# Patient Record
Sex: Female | Born: 1965 | Race: Black or African American | Hispanic: No | Marital: Single | Smoking: Current every day smoker
Health system: Southern US, Community
[De-identification: ages and names within clinical notes are randomized; demographics above are authoritative.]

## PROBLEM LIST (undated history)

## (undated) DIAGNOSIS — R6889 Other general symptoms and signs: Secondary | ICD-10-CM

## (undated) DIAGNOSIS — R195 Other fecal abnormalities: Secondary | ICD-10-CM

## (undated) DIAGNOSIS — Z1211 Encounter for screening for malignant neoplasm of colon: Secondary | ICD-10-CM

## (undated) DIAGNOSIS — I10 Essential (primary) hypertension: Secondary | ICD-10-CM

## (undated) HISTORY — PX: TUBAL LIGATION: SHX77

---

## 2012-11-29 ENCOUNTER — Emergency Department (HOSPITAL_BASED_OUTPATIENT_CLINIC_OR_DEPARTMENT_OTHER)
Admission: EM | Admit: 2012-11-29 | Discharge: 2012-11-29 | Disposition: A | Discharge status: Home / Self Care | Payer: Self-pay | Attending: Emergency Medicine | Admitting: Emergency Medicine

## 2012-11-29 ENCOUNTER — Encounter (HOSPITAL_BASED_OUTPATIENT_CLINIC_OR_DEPARTMENT_OTHER): Payer: Self-pay | Admitting: *Deleted

## 2012-11-29 DIAGNOSIS — Z79899 Other long term (current) drug therapy: Secondary | ICD-10-CM | POA: Insufficient documentation

## 2012-11-29 DIAGNOSIS — I10 Essential (primary) hypertension: Secondary | ICD-10-CM | POA: Insufficient documentation

## 2012-11-29 DIAGNOSIS — F17200 Nicotine dependence, unspecified, uncomplicated: Secondary | ICD-10-CM | POA: Insufficient documentation

## 2012-11-29 DIAGNOSIS — R002 Palpitations: Secondary | ICD-10-CM | POA: Insufficient documentation

## 2012-11-29 DIAGNOSIS — Z880 Allergy status to penicillin: Secondary | ICD-10-CM | POA: Insufficient documentation

## 2012-11-29 DIAGNOSIS — D649 Anemia, unspecified: Secondary | ICD-10-CM | POA: Insufficient documentation

## 2012-11-29 HISTORY — DX: Essential (primary) hypertension: I10

## 2012-11-29 LAB — BASIC METABOLIC PANEL
CO2: 24 mEq/L (ref 19–32)
Calcium: 9.7 mg/dL (ref 8.4–10.5)
Glucose, Bld: 90 mg/dL (ref 70–99)
Sodium: 142 mEq/L (ref 135–145)

## 2012-11-29 LAB — CBC WITH DIFFERENTIAL/PLATELET
Eosinophils Relative: 2 % (ref 0–5)
HCT: 30.1 % — ABNORMAL LOW (ref 36.0–46.0)
Lymphocytes Relative: 28 % (ref 12–46)
Lymphs Abs: 2.9 10*3/uL (ref 0.7–4.0)
MCV: 79.8 fL (ref 78.0–100.0)
Monocytes Absolute: 0.6 10*3/uL (ref 0.1–1.0)
Platelets: 525 10*3/uL — ABNORMAL HIGH (ref 150–400)
RBC: 3.77 MIL/uL — ABNORMAL LOW (ref 3.87–5.11)
WBC: 10.1 10*3/uL (ref 4.0–10.5)

## 2012-11-29 LAB — URINE MICROSCOPIC-ADD ON

## 2012-11-29 LAB — URINALYSIS, ROUTINE W REFLEX MICROSCOPIC
Glucose, UA: NEGATIVE mg/dL
Ketones, ur: NEGATIVE mg/dL
Specific Gravity, Urine: 1.02 (ref 1.005–1.030)
pH: 6 (ref 5.0–8.0)

## 2012-11-29 MED ORDER — HYDROCHLOROTHIAZIDE 25 MG PO TABS: 25.0000 mg | ORAL_TABLET | Freq: Every day | ORAL | Status: DC

## 2012-11-29 MED ORDER — FERROUS SULFATE 325 (65 FE) MG PO TABS: 325.0000 mg | ORAL_TABLET | Freq: Every day | ORAL | Status: AC

## 2012-11-29 NOTE — ED Notes (Signed)
Pt stated earlier today took a shower, got short of breath, felt like her heart was racing, felt nauseous, chest pain, felt hot. States she still feels this way. Denies vomiting. States she's had similar episodes for the last three years.

## 2012-11-29 NOTE — ED Provider Notes (Signed)
History    CSN: [id number] Arrival date & time 11/29/12  1608  First MD Initiated Contact with Patient 11/29/12 1702     Chief Complaint  Patient presents with  . Hypertension   (Consider location/radiation/quality/duration/timing/severity/associated sxs/prior Treatment) Patient is a 47 y.o. female presenting with hypertension.  Hypertension   Pt with history of HTN, not taking any medications for the last 2 years reports she was in the shower earlier today and began to feel 'claustophobic', states she began feeling like her heart was racing and having headache and blurry vision. She sat outside for a few minutes and then went to run some errands. While she was out, she stopped at a drug store and checked her BP and found it to be 140/90s. She came to the ED for further eval. She reports she has had moderate aching pain in back of head, neck and moving around to her L arm and shoulder and down to L chest for the last 3 years with multiple prior evaluations at [facility]. She does not have PCP. Feeling asymptomatic now.  Past Medical History  Diagnosis Date  . Hypertension    History reviewed. No pertinent past surgical history. History reviewed. No pertinent family history. History  Substance Use Topics  . Smoking status: Current Every Day Smoker    Types: Cigarettes  . Smokeless tobacco: Not on file  . Alcohol Use: No   OB History   Grav Para Term Preterm Abortions TAB SAB Ect Mult Living                 Review of Systems All other systems reviewed and are negative except as noted in HPI.   Allergies  Penicillins and Sulfa antibiotics  Home Medications   Current Outpatient Rx  Name  Route  Sig  Dispense  Refill  . hydrochlorothiazide (HYDRODIURIL) 25 MG tablet   Oral   Take 25 mg by mouth daily.          BP 141/88  Pulse 95  Temp(Src) 98.4 F (36.9 C)  Resp 16  Ht 5\' 4"  (1.626 m)  Wt 190 lb (86.183 kg)  BMI 32.6 kg/m2  SpO2 100%  LMP 11/02/2012 Physical Exam   Nursing note and vitals reviewed. Constitutional: She is oriented to person, place, and time. She appears well-developed and well-nourished.  HENT:  Head: Normocephalic and atraumatic.  Eyes: EOM are normal. Pupils are equal, round, and reactive to light.  Neck: Normal range of motion. Neck supple.  Cardiovascular: Normal rate, normal heart sounds and intact distal pulses.   Pulmonary/Chest: Effort normal and breath sounds normal.  Abdominal: Bowel sounds are normal. She exhibits no distension. There is no tenderness.  Musculoskeletal: Normal range of motion. She exhibits no edema and no tenderness.  Neurological: She is alert and oriented to person, place, and time. She has normal strength. No cranial nerve deficit or sensory deficit.  Skin: Skin is warm and dry. No rash noted.  Psychiatric: She has a normal mood and affect.    ED Course  Procedures (including critical care time) Labs Reviewed  CBC WITH DIFFERENTIAL - Abnormal; Notable for the following:    RBC 3.77 (*)    Hemoglobin 9.7 (*)    HCT 30.1 (*)    MCH 25.7 (*)    Platelets 525 (*)    All other components within normal limits  BASIC METABOLIC PANEL - Abnormal; Notable for the following:    GFR calc non Af Amer 67 (*)  GFR calc Af Amer 78 (*)    All other components within normal limits  URINALYSIS, ROUTINE W REFLEX MICROSCOPIC - Abnormal; Notable for the following:    Leukocytes, UA SMALL (*)    All other components within normal limits  URINE MICROSCOPIC-ADD ON - Abnormal; Notable for the following:    Bacteria, UA FEW (*)    All other components within normal limits  URINE CULTURE  TROPONIN I   No results found. 1. Palpitations   2. HTN (hypertension)   3. Anemia     MDM   Date: 11/29/2012  Rate: 89  Rhythm: normal sinus rhythm  QRS Axis: normal  Intervals: normal  ST/T Wave abnormalities: normal  Conduction Disutrbances:none  Narrative Interpretation:   Old EKG Reviewed: none available  6:21  PM Pt remains asymptomatic. No tachycardia here. Labs reviewed, moderate anemia attributable to heavy periods the last 5 months. Advised HCTZ for HTN and Fe for anemia with close PCP followup for recheck in 1-2 weeks.    Charles B. Bernette Mayers, MD 11/29/12 Rickey Primus

## 2012-11-29 NOTE — ED Notes (Addendum)
Pt c/o hypertension with blurred vision and SOB, ha  x 2 days also c/o palpitations

## 2012-12-01 LAB — URINE CULTURE

## 2012-12-03 ENCOUNTER — Telehealth (HOSPITAL_COMMUNITY): Payer: Self-pay | Admitting: Emergency Medicine

## 2012-12-03 NOTE — ED Notes (Signed)
Post ED Visit - Positive Culture Follow-up  Culture report reviewed by antimicrobial stewardship pharmacist: []  Wes Dulaney, Pharm.D., BCPS [x]  Celedonio Miyamoto, Pharm.D., BCPS []  Georgina Pillion, Pharm.D., BCPS []  [place], [address].D., BCPS, AAHIVP []  Estella Husk, Pharm.D., BCPS, AAHIVP  Positive urine culture Per Roxy Horseman PA-C, no treatment since asymptomatic and no further patient follow-up is required at this time.  Kylie A Holland 12/03/2012, 9:36 AM

## 2012-12-21 ENCOUNTER — Ambulatory Visit: Payer: Self-pay | Attending: Family Medicine | Admitting: Internal Medicine

## 2012-12-21 VITALS — BP 129/88 | HR 91 | Temp 97.7°F | Resp 17 | Wt 188.4 lb

## 2012-12-21 DIAGNOSIS — I10 Essential (primary) hypertension: Secondary | ICD-10-CM | POA: Insufficient documentation

## 2012-12-21 DIAGNOSIS — M542 Cervicalgia: Secondary | ICD-10-CM | POA: Insufficient documentation

## 2012-12-21 DIAGNOSIS — F329 Major depressive disorder, single episode, unspecified: Secondary | ICD-10-CM | POA: Insufficient documentation

## 2012-12-21 DIAGNOSIS — M545 Low back pain, unspecified: Secondary | ICD-10-CM | POA: Insufficient documentation

## 2012-12-21 DIAGNOSIS — F32A Depression, unspecified: Secondary | ICD-10-CM | POA: Insufficient documentation

## 2012-12-21 NOTE — Progress Notes (Signed)
Patient here to establish care Was in ED recently for palpatations and anemia Takes medication for blood pressure and iron pills

## 2012-12-21 NOTE — Progress Notes (Signed)
Patient ID: Jacqueline Kerr, female   DOB: 03-25-1966, 47 y.o.   MRN: [id number]  CC: To establish care  HPI: Jacqueline Kerr is a 47 year old woman seen here today to establish medical care. She has complaints of neck pain and low back pain and is chronic. She has history of hypertension. She does not remember any trauma to the neck or to the back. No numbness of any limb. She admits to having a very stressful life condition especially financially and socially, she is not married, no children. She takes hydrochlorothiazide for blood pressure. She was recently treated for UTI.   Allergies  Allergen Reactions  . Penicillins   . Sulfa Antibiotics    Past Medical History  Diagnosis Date  . Hypertension    Current Outpatient Prescriptions on File Prior to Visit  Medication Sig Dispense Refill  . ferrous sulfate 325 (65 FE) MG tablet Take 1 tablet (325 mg total) by mouth daily.  30 tablet  0  . hydrochlorothiazide (HYDRODIURIL) 25 MG tablet Take 1 tablet (25 mg total) by mouth daily.  30 tablet  2   No current facility-administered medications on file prior to visit.   History reviewed. No pertinent family history. History   Social History  . Marital Status: Single    Spouse Name: N/A    Number of Children: N/A  . Years of Education: N/A   Occupational History  . Not on file.   Social History Main Topics  . Smoking status: Current Every Day Smoker    Types: Cigarettes  . Smokeless tobacco: Not on file  . Alcohol Use: No  . Drug Use: No  . Sexual Activity: Yes    Birth Control/ Protection: None   Other Topics Concern  . Not on file   Social History Narrative  . No narrative on file    Review of Systems: Constitutional: Negative for fever, chills, diaphoresis, activity change, appetite change and fatigue. HENT: Negative for ear pain, nosebleeds, congestion, facial swelling, rhinorrhea, neck pain, neck stiffness and ear discharge.  Eyes: Negative for pain, discharge, redness,  itching and visual disturbance. Respiratory: Negative for cough, choking, chest tightness, shortness of breath, wheezing and stridor.  Cardiovascular: Negative for chest pain, palpitations and leg swelling. Gastrointestinal: Negative for abdominal distention. Genitourinary: Negative for dysuria, urgency, frequency, hematuria, flank pain, decreased urine volume, difficulty urinating and dyspareunia.  Musculoskeletal: Negative for back pain, joint swelling, arthralgias and gait problem. Neurological: Negative for dizziness, tremors, seizures, syncope, facial asymmetry, speech difficulty, weakness, light-headedness, numbness and headaches.  Hematological: Negative for adenopathy. Does not bruise/bleed easily. Psychiatric/Behavioral: Negative for hallucinations, behavioral problems, confusion, dysphoric mood, decreased concentration and agitation.    Objective:   Filed Vitals:   12/21/12 1220  BP: 129/88  Pulse: 91  Temp: 97.7 F (36.5 C)  Resp: 17    Physical Exam: Constitutional: Patient appears well-developed and well-nourished. No distress. HENT: Normocephalic, atraumatic, External right and left ear normal. Oropharynx is clear and moist.  Eyes: Conjunctivae and EOM are normal. PERRLA, no scleral icterus. Neck: Normal ROM. Neck supple. No JVD. No tracheal deviation. No thyromegaly. CVS: RRR, S1/S2 +, no murmurs, no gallops, no carotid bruit.  Pulmonary: Effort and breath sounds normal, no stridor, rhonchi, wheezes, rales.  Abdominal: Soft. BS +,  no distension, tenderness, rebound or guarding.  Musculoskeletal: Normal range of motion. No edema and no tenderness.  Lymphadenopathy: No lymphadenopathy noted, cervical, inguinal or axillary Neuro: Alert. Normal reflexes, muscle tone coordination. No cranial nerve deficit. Skin: Skin  is warm and dry. No rash noted. Not diaphoretic. No erythema. No pallor. Psychiatric: Normal mood and affect. Behavior, judgment, thought content  normal.  Lab Results  Component Value Date   WBC 10.1 11/29/2012   HGB 9.7* 11/29/2012   HCT 30.1* 11/29/2012   MCV 79.8 11/29/2012   PLT 525* 11/29/2012   Lab Results  Component Value Date   CREATININE 1.00 11/29/2012   BUN 9 11/29/2012   NA 142 11/29/2012   K 3.7 11/29/2012   CL 106 11/29/2012   CO2 24 11/29/2012    No results found for this basename: HGBA1C   Lipid Panel  No results found for this basename: chol, trig, hdl, cholhdl, vldl, ldlcalc       Assessment and plan:   Patient Active Problem List   Diagnosis Date Noted  . Essential hypertension, benign 12/21/2012  . Neck pain, musculoskeletal 12/21/2012  . Low back pain 12/21/2012  . Depression (emotion) 12/21/2012   Continue hydrochlorothiazide 25 mg by mouth daily Continue ferrous sulfate 325 mg tablet by mouth daily Patient was referred to a Child psychotherapist here for counseling and resources available were given to patient Will obtain x-ray of the cervical spine x-ray of the lumbar spine Patient declined referral to psychiatrist, saying she has enough support at home, lives with family members including parents and siblings  Norlene Lanes was given clear instructions to go to ER or return to the clinic if symptoms don't improve, worsen or new problems develop.  Emika Tiano verbalized understanding.  Camaria Gerald was told to call to get lab results if hasn't heard anything in the next week.        Jeanann Lewandowsky, MD [facility] [place], [place] [phone]   12/21/2012, 6:29 PM

## 2012-12-22 ENCOUNTER — Ambulatory Visit (HOSPITAL_COMMUNITY)
Admission: RE | Admit: 2012-12-22 | Discharge: 2012-12-22 | Disposition: A | Discharge status: Home / Self Care | Payer: Self-pay | Source: Ambulatory Visit | Attending: Internal Medicine | Admitting: Internal Medicine

## 2012-12-22 ENCOUNTER — Encounter: Payer: Self-pay | Admitting: Internal Medicine

## 2012-12-22 ENCOUNTER — Telehealth: Payer: Self-pay | Admitting: *Deleted

## 2012-12-22 DIAGNOSIS — M542 Cervicalgia: Secondary | ICD-10-CM

## 2012-12-22 DIAGNOSIS — M47812 Spondylosis without myelopathy or radiculopathy, cervical region: Secondary | ICD-10-CM | POA: Insufficient documentation

## 2012-12-22 DIAGNOSIS — M5380 Other specified dorsopathies, site unspecified: Secondary | ICD-10-CM | POA: Insufficient documentation

## 2012-12-22 DIAGNOSIS — M545 Low back pain, unspecified: Secondary | ICD-10-CM | POA: Insufficient documentation

## 2012-12-22 LAB — HEMOGLOBIN A1C
Hgb A1c MFr Bld: 5.2 % (ref <5.7)
Mean Plasma Glucose: 103 mg/dL (ref <117)

## 2012-12-22 LAB — TSH: TSH: 1.456 u[IU]/mL (ref 0.350–4.500)

## 2012-12-22 NOTE — Telephone Encounter (Signed)
12/22/12 Attempt to reach patient not available at number provided. P.Zackory Pudlo,RN BSN [facility]

## 2012-12-25 ENCOUNTER — Telehealth: Payer: Self-pay

## 2012-12-25 NOTE — Telephone Encounter (Signed)
Patient aware of xray results.   

## 2012-12-25 NOTE — Telephone Encounter (Signed)
Message copied by Lestine Mount on Mon Dec 25, 2012  9:12 AM ------      Message from: Quentin Angst      Created: Fri Dec 22, 2012  5:36 PM       Please call patient and informed her that her x-ray of the neck and low back came back within normal. ------

## 2013-03-15 ENCOUNTER — Other Ambulatory Visit: Payer: Self-pay

## 2013-12-31 ENCOUNTER — Ambulatory Visit (HOSPITAL_COMMUNITY)
Admission: RE | Admit: 2013-12-31 | Discharge: 2013-12-31 | Disposition: A | Discharge status: Home / Self Care | Payer: Self-pay | Source: Ambulatory Visit | Attending: Internal Medicine | Admitting: Internal Medicine

## 2013-12-31 ENCOUNTER — Ambulatory Visit: Payer: Self-pay | Attending: Internal Medicine | Admitting: Internal Medicine

## 2013-12-31 ENCOUNTER — Encounter: Payer: Self-pay | Admitting: Internal Medicine

## 2013-12-31 ENCOUNTER — Other Ambulatory Visit: Payer: Self-pay

## 2013-12-31 VITALS — BP 154/96 | HR 79 | Temp 98.1°F | Resp 16 | Ht 60.0 in | Wt 190.2 lb

## 2013-12-31 DIAGNOSIS — Z880 Allergy status to penicillin: Secondary | ICD-10-CM | POA: Insufficient documentation

## 2013-12-31 DIAGNOSIS — M25519 Pain in unspecified shoulder: Secondary | ICD-10-CM | POA: Insufficient documentation

## 2013-12-31 DIAGNOSIS — M7989 Other specified soft tissue disorders: Secondary | ICD-10-CM | POA: Insufficient documentation

## 2013-12-31 DIAGNOSIS — I10 Essential (primary) hypertension: Secondary | ICD-10-CM

## 2013-12-31 DIAGNOSIS — R200 Anesthesia of skin: Secondary | ICD-10-CM

## 2013-12-31 DIAGNOSIS — Z8249 Family history of ischemic heart disease and other diseases of the circulatory system: Secondary | ICD-10-CM | POA: Insufficient documentation

## 2013-12-31 DIAGNOSIS — R0789 Other chest pain: Secondary | ICD-10-CM

## 2013-12-31 DIAGNOSIS — R202 Paresthesia of skin: Secondary | ICD-10-CM

## 2013-12-31 DIAGNOSIS — F17200 Nicotine dependence, unspecified, uncomplicated: Secondary | ICD-10-CM | POA: Insufficient documentation

## 2013-12-31 DIAGNOSIS — Z882 Allergy status to sulfonamides status: Secondary | ICD-10-CM | POA: Insufficient documentation

## 2013-12-31 DIAGNOSIS — R209 Unspecified disturbances of skin sensation: Secondary | ICD-10-CM | POA: Insufficient documentation

## 2013-12-31 MED ORDER — HYDROCHLOROTHIAZIDE 25 MG PO TABS: 25.0000 mg | ORAL_TABLET | Freq: Every day | ORAL | Status: AC

## 2013-12-31 MED ORDER — CYCLOBENZAPRINE HCL 10 MG PO TABS: 10.0000 mg | ORAL_TABLET | Freq: Every day | ORAL | Status: AC

## 2013-12-31 NOTE — Progress Notes (Signed)
Patient ID: Jacqueline Kerr, female   DOB: [birth year], 48 y.o.   MRN: [id number]  CC: left arm pain  HPI: Patient presents to clinic for headaches and bilateral leg swelling for the past ten days.  Patient reports that she has been out of HCTZ since May.  She states that her left shoulder has been painful and it causes the pain to radiate to her neck and upper back to left breast.  Some chest discomfort has been noted for the past ten days as well with numbness and ache in her left fingers.   Allergies  Allergen Reactions  . Penicillins   . Sulfa Antibiotics    Past Medical History  Diagnosis Date  . Hypertension    Current Outpatient Prescriptions on File Prior to Visit  Medication Sig Dispense Refill  . ferrous sulfate 325 (65 FE) MG tablet Take 1 tablet (325 mg total) by mouth daily.  30 tablet  0  . hydrochlorothiazide (HYDRODIURIL) 25 MG tablet Take 1 tablet (25 mg total) by mouth daily.  30 tablet  2   No current facility-administered medications on file prior to visit.   Family History  Problem Relation Age of Onset  . Hypertension Mother   . Hypertension Father    History   Social History  . Marital Status: Single    Spouse Name: N/A    Number of Children: N/A  . Years of Education: N/A   Occupational History  . Not on file.   Social History Main Topics  . Smoking status: Current Every Day Smoker    Types: Cigarettes  . Smokeless tobacco: Not on file  . Alcohol Use: No  . Drug Use: No  . Sexual Activity: Yes    Birth Control/ Protection: None   Other Topics Concern  . Not on file   Social History Narrative  . No narrative on file    Review of Systems  Eyes: Negative.   Respiratory: Negative.   Cardiovascular: Positive for chest pain and leg swelling. Negative for palpitations, orthopnea and claudication.  Neurological: Positive for tingling and headaches. Negative for dizziness.   .    Objective:   Filed Vitals:   12/31/13 1728  BP: 154/96   Pulse: 79  Temp: 98.1 F (36.7 C)  Resp: 16    Physical Exam: Constitutional: Patient appears well-developed and well-nourished. No distress. HENT: Normocephalic, atraumatic, External right and left ear normal. Oropharynx is clear and moist.  Eyes: Conjunctivae and EOM are normal. PERRLA, no scleral icterus. Neck: Normal ROM. Neck supple. No JVD. No tracheal deviation. No thyromegaly. CVS: RRR, S1/S2 +, no murmurs, no gallops, no carotid bruit.  Pulmonary: Effort and breath sounds normal, no stridor, rhonchi, wheezes, rales.  Abdominal: Soft. BS +,  no distension, tenderness, rebound or guarding.  Musculoskeletal: Normal range of motion. No edema and no tenderness.  Lymphadenopathy: No lymphadenopathy noted, cervical Neuro: Alert. Normal reflexes, muscle tone coordination. No cranial nerve deficit. Skin: Skin is warm and dry. No rash noted. Not diaphoretic. No erythema. No pallor. Psychiatric: Normal mood and affect. Behavior, judgment, thought content normal.  Lab Results  Component Value Date   WBC 10.1 11/29/2012   HGB 9.7* 11/29/2012   HCT 30.1* 11/29/2012   MCV 79.8 11/29/2012   PLT 525* 11/29/2012   Lab Results  Component Value Date   CREATININE 1.00 11/29/2012   BUN 9 11/29/2012   NA 142 11/29/2012   K 3.7 11/29/2012   CL 106 11/29/2012   CO2 24  11/29/2012    Lab Results  Component Value Date   HGBA1C 5.2 12/21/2012   Lipid Panel  No results found for this basename: chol, trig, hdl, cholhdl, vldl, ldlcalc       Assessment and plan:   Gitel was seen today for leg swelling and shoulder pain.  Diagnoses and associated orders for this visit:  Numbness and tingling in left arm - CBC - COMPLETE METABOLIC PANEL WITH GFR - DG Shoulder Left; Future - Troponin I  Essential hypertension - hydrochlorothiazide (HYDRODIURIL) 25 MG tablet; Take 1 tablet (25 mg total) by mouth daily.  Other Orders - cyclobenzaprine (FLEXERIL) 10 MG tablet; Take 1 tablet (10 mg total) by  mouth at bedtime.   Gave patient strict return precautions that should warrant prompt evaluation by the ER.  I will call patient with results from Troponin.       Holland Commons, NP-C [facility] [phone] 12/31/2013, 5:59 PM

## 2013-12-31 NOTE — Progress Notes (Signed)
Pt complain of swelling on legs and feet, Hx HBP not taking medication  Stated shoulder RT pain no injury.

## 2014-01-01 ENCOUNTER — Telehealth: Payer: Self-pay | Admitting: *Deleted

## 2014-01-01 LAB — COMPLETE METABOLIC PANEL WITH GFR
ALT: 11 U/L (ref 0–35)
AST: 17 U/L (ref 0–37)
Albumin: 4.4 g/dL (ref 3.5–5.2)
Alkaline Phosphatase: 85 U/L (ref 39–117)
BUN: 8 mg/dL (ref 6–23)
CALCIUM: 9.1 mg/dL (ref 8.4–10.5)
CHLORIDE: 110 meq/L (ref 96–112)
CO2: 25 meq/L (ref 19–32)
CREATININE: 0.89 mg/dL (ref 0.50–1.10)
GFR, EST AFRICAN AMERICAN: 89 mL/min
GFR, EST NON AFRICAN AMERICAN: 77 mL/min
GLUCOSE: 86 mg/dL (ref 70–99)
Potassium: 4.2 mEq/L (ref 3.5–5.3)
Sodium: 143 mEq/L (ref 135–145)
Total Bilirubin: 0.4 mg/dL (ref 0.2–1.2)
Total Protein: 6.9 g/dL (ref 6.0–8.3)

## 2014-01-01 LAB — CBC
HEMATOCRIT: 26.5 % — AB (ref 36.0–46.0)
HEMOGLOBIN: 7.6 g/dL — AB (ref 12.0–15.0)
MCH: 17.6 pg — ABNORMAL LOW (ref 26.0–34.0)
MCHC: 28.7 g/dL — ABNORMAL LOW (ref 30.0–36.0)
MCV: 61.2 fL — AB (ref 78.0–100.0)
PLATELETS: 492 10*3/uL — AB (ref 150–400)
RBC: 4.33 MIL/uL (ref 3.87–5.11)
RDW: 20.9 % — ABNORMAL HIGH (ref 11.5–15.5)
WBC: 7.9 10*3/uL (ref 4.0–10.5)

## 2014-01-01 LAB — TROPONIN I: Troponin I: 0.01 ng/mL (ref <0.06)

## 2014-01-01 NOTE — Telephone Encounter (Signed)
Left voice massage to returned call    Result Note      Please send patient ferrous sulfate 325 mg to be taken TID with meals. May need stool softener to help with constipation.  She is anemic

## 2014-01-02 ENCOUNTER — Telehealth: Payer: Self-pay | Admitting: Emergency Medicine

## 2014-01-02 ENCOUNTER — Telehealth: Payer: Self-pay | Admitting: Internal Medicine

## 2014-01-02 NOTE — Telephone Encounter (Signed)
Pt given negative blood work report. Verbalized understanding

## 2014-01-02 NOTE — Telephone Encounter (Signed)
Pt. Called again, please call patient with results

## 2014-01-02 NOTE — Telephone Encounter (Signed)
Pt. Returning nurses call. Please f/u with pt.  °

## 2014-01-02 NOTE — Telephone Encounter (Signed)
Message copied by Darlis Loan on Wed Jan 02, 2014  2:52 PM ------      Message from: Holland Commons A      Created: Wed Jan 02, 2014  9:59 AM       Let her know that her cardiac blood work is negative and that I am not concerned at this point that it is cardiac in nature-chest discomfort. ------

## 2014-07-24 IMAGING — CR DG CERVICAL SPINE 1V
1 series · 1 of 1 positions shown · non-contrast
Comparison: None.

CLINICAL DATA: Neck pain extends superiorly since 7566.

DG CERVICAL SPINE - 1 VIEW

[w cervical spine lat]
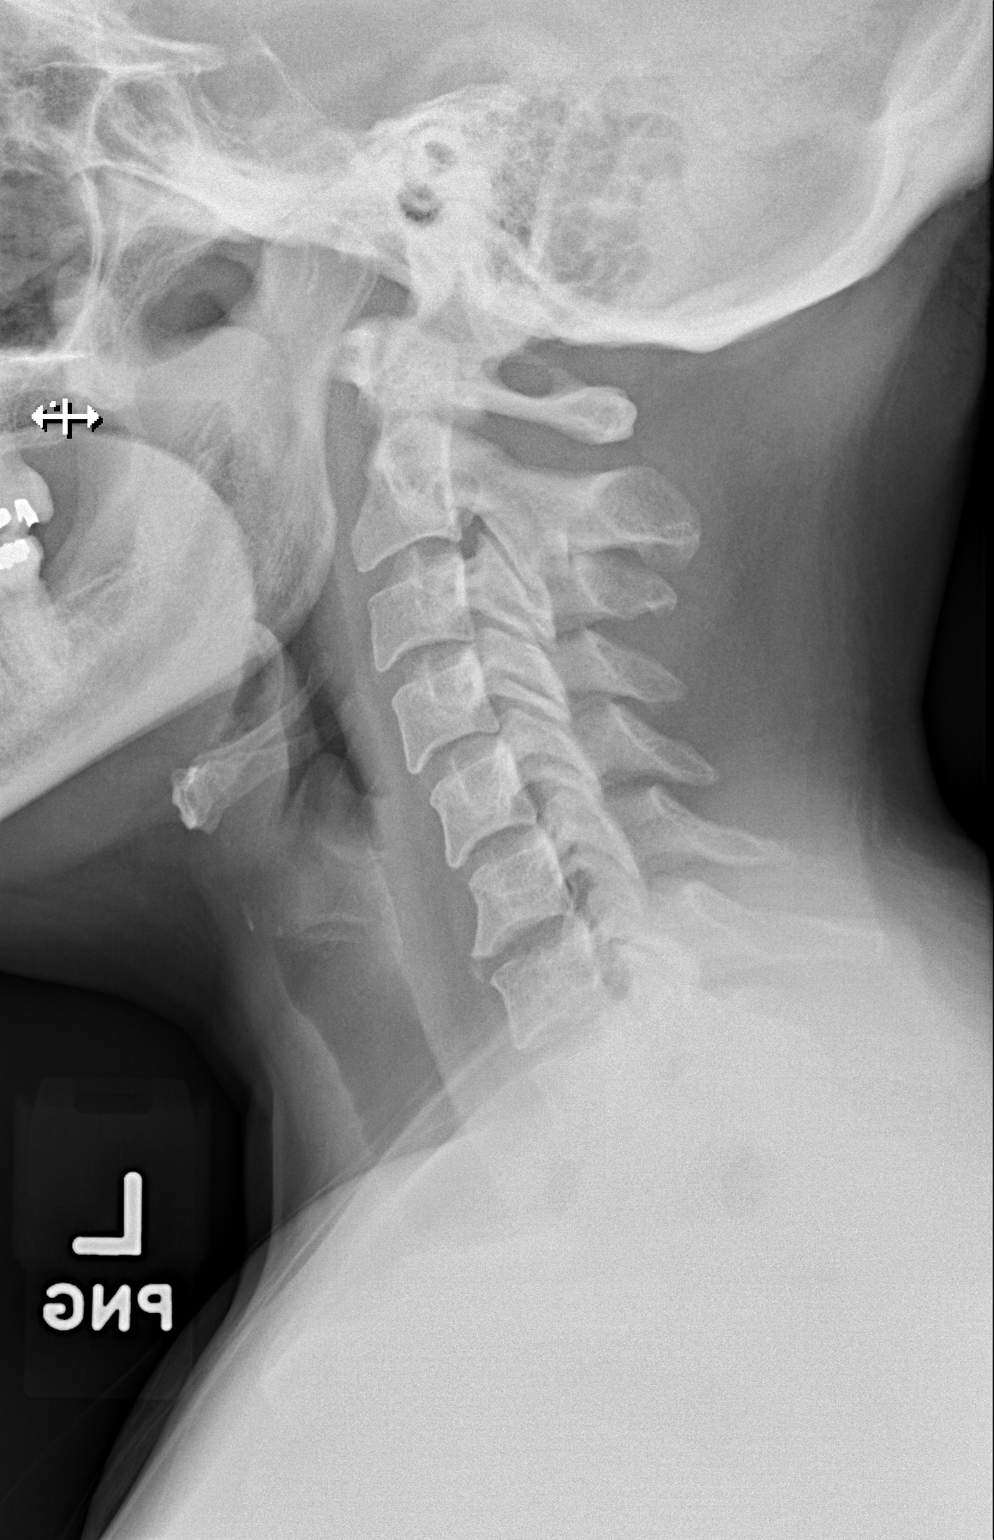

[1 of 1 positions shown; findings below may reference images not displayed]

FINDING: Mild degenerative changes C6-7 level with anterior
osteophyte.  On this single lateral view, no other abnormality is
noted.}s:
IMPRESSION: Mild degenerative changes C6-7 level with anterior osteophyte.  On
this single lateral view, no other abnormality is noted.

## 2014-07-24 IMAGING — CR DG LUMBAR SPINE COMPLETE 4+V
5 series · 5 of 5 positions shown · non-contrast
Comparison: None

CLINICAL DATA: Low back pain

LUMBAR SPINE - COMPLETE 4+ VIEW

[t l-spine a.p.]
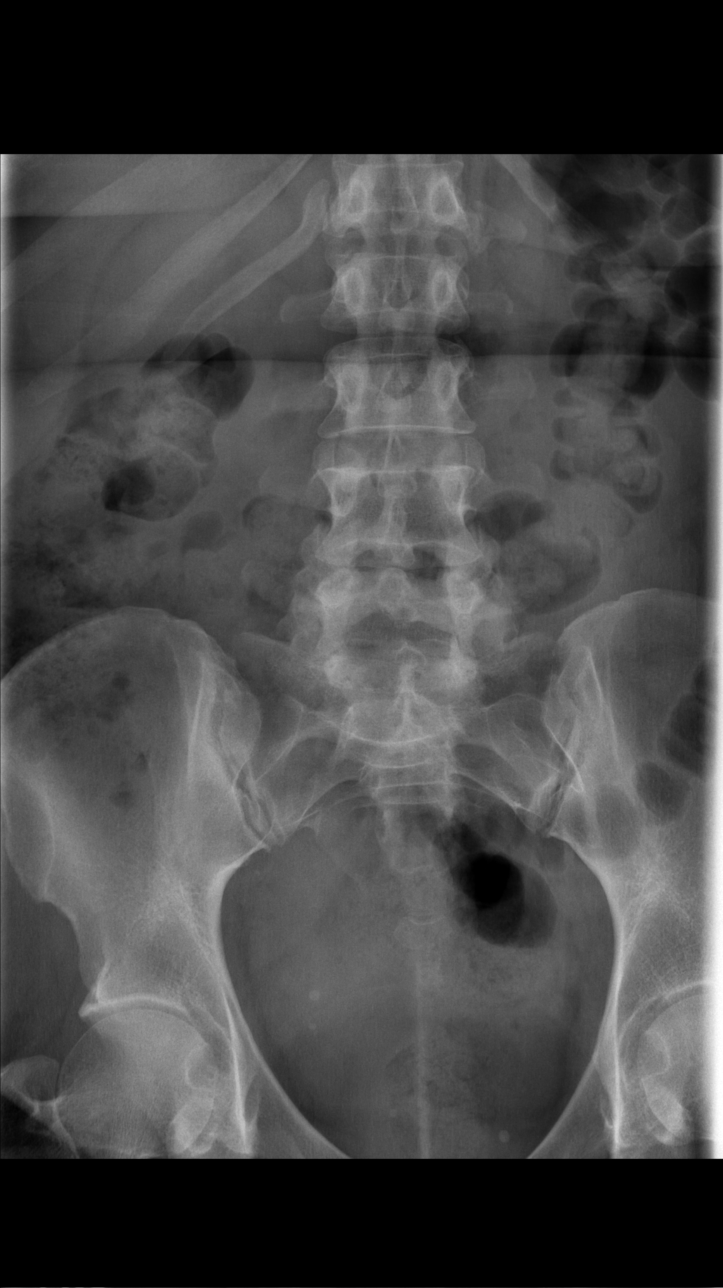

[t l-spine oblique exposure (1 of 2)]
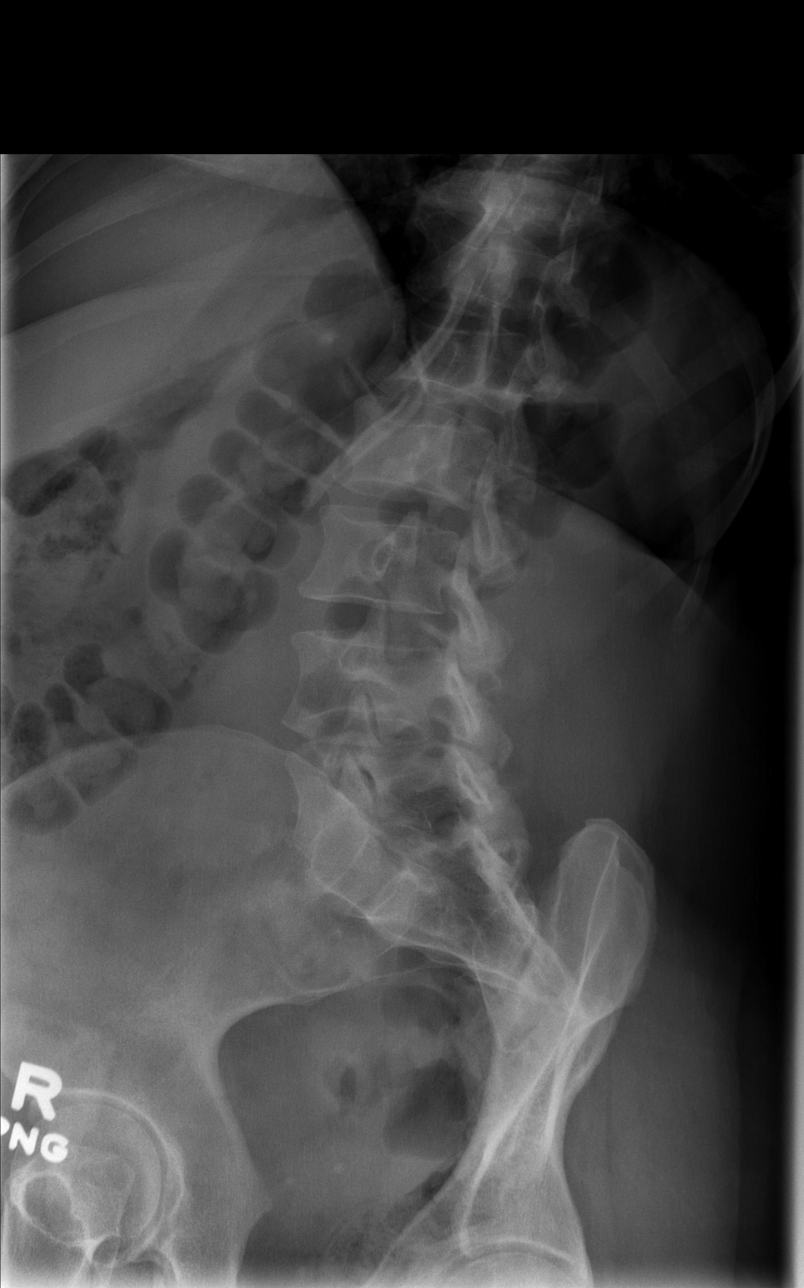

[t l-spine oblique exposure (2 of 2)]
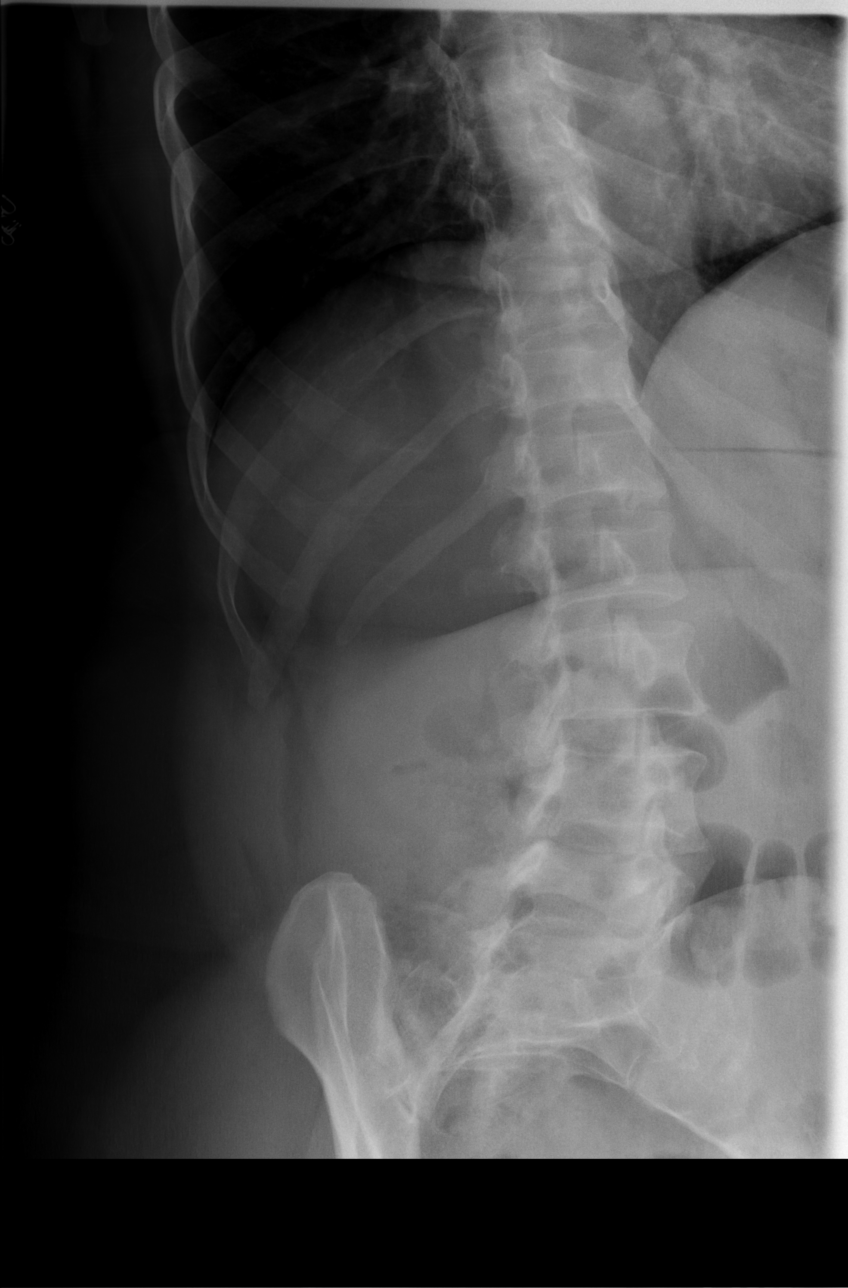

[t l-spine lat]
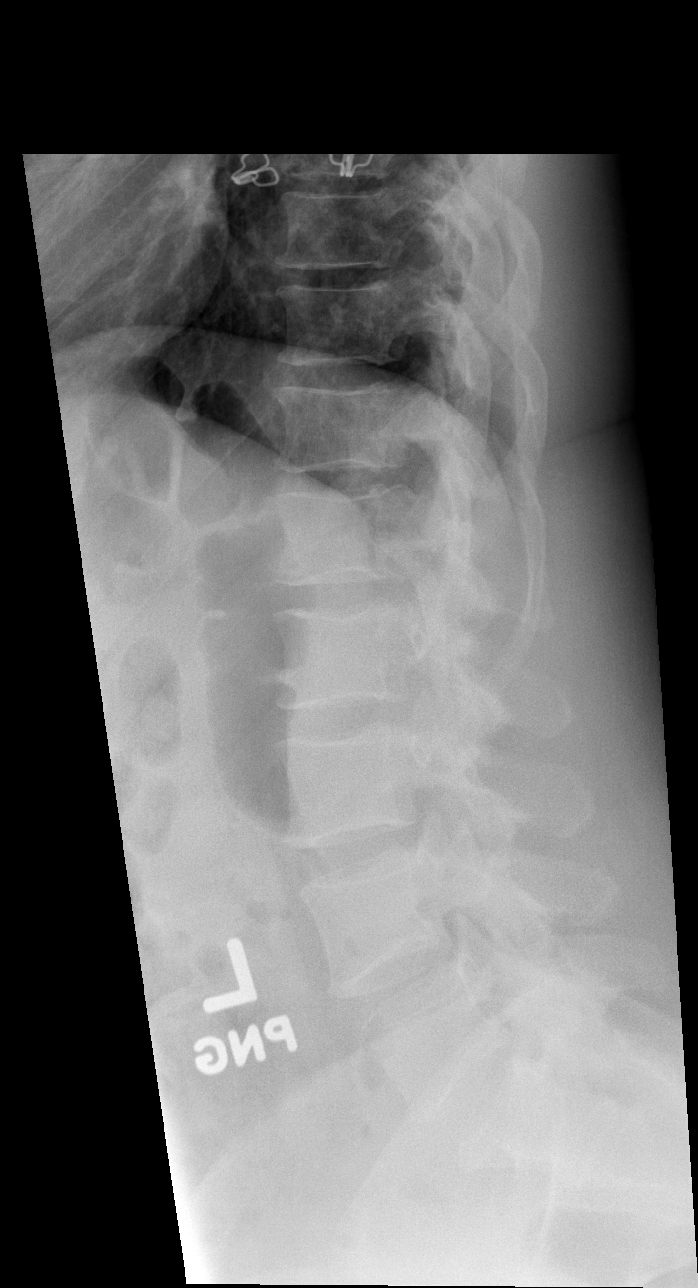

[t l-spine l5-s1 spot]
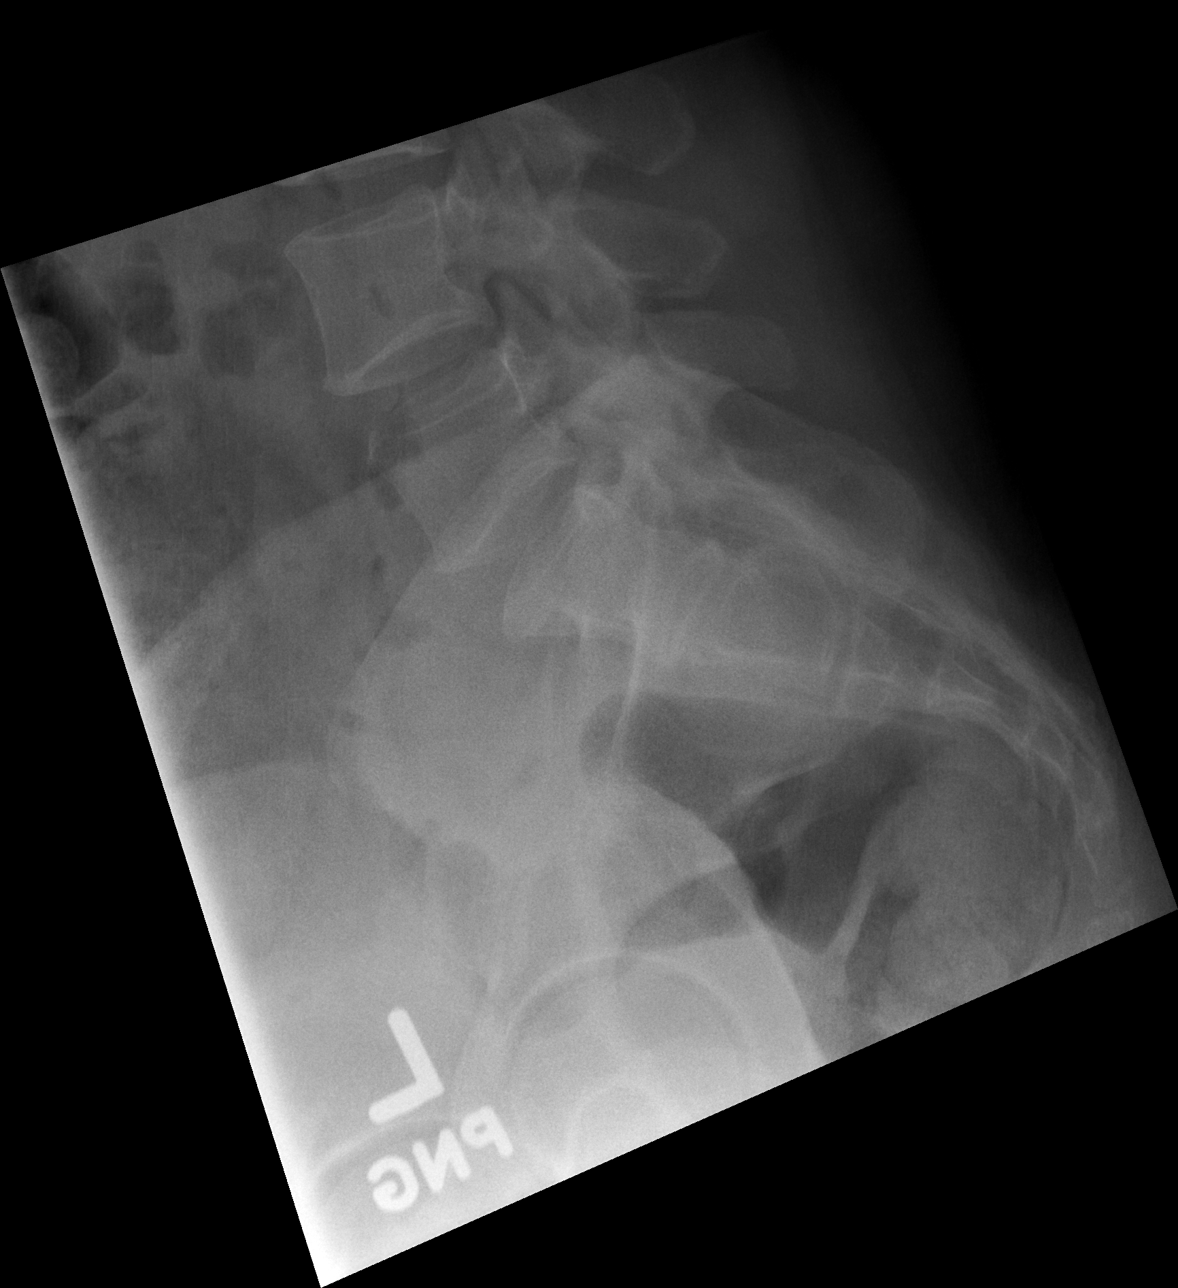

[5 of 5 positions shown; findings below may reference images not displayed]

FINDINGS: Normal alignment.  Negative for fracture or mass.
Negative for pars defect.  Disc spaces are maintained and there is
no significant degenerative change.
IMPRESSION: Negative

## 2020-09-25 LAB — PAP IG, APTIMA HPV AND RFX 16/18,45 (199305)
.: 0
HPV Aptima: NEGATIVE

## 2021-01-02 ENCOUNTER — Telehealth

## 2021-01-02 LAB — FECAL DNA COLORECTAL CANCER SCREENING (COLOGUARD): FIT-DNA (Cologuard): POSITIVE — AB

## 2021-01-02 NOTE — Telephone Encounter (Signed)
POSITIVE COLOGUARD

## 2021-01-05 NOTE — Telephone Encounter (Signed)
Attempted to call. NA. Left VM. Will also send mychart message.

## 2021-01-05 NOTE — Telephone Encounter (Signed)
Spoke to pt. Reviewed pos cologuard. Ref to Dr. Nelva Bush placed.

## 2021-01-05 NOTE — Telephone Encounter (Signed)
[employer] called in regards to pt abnormal results callback number [phone]

## 2021-01-06 NOTE — Telephone Encounter (Signed)
Spoke to [facility]. Confirmed we received pos test results.

## 2021-01-28 ENCOUNTER — Encounter: Attending: Family Medicine | Primary: Family Medicine

## 2021-02-17 ENCOUNTER — Encounter: Payer: BLUE CROSS/BLUE SHIELD | Attending: Family Medicine | Primary: Family Medicine

## 2021-03-19 ENCOUNTER — Telehealth
Admit: 2021-03-19 | Discharge: 2021-03-19 | Payer: BLUE CROSS/BLUE SHIELD | Attending: Family Medicine | Primary: Family Medicine

## 2021-03-19 DIAGNOSIS — I10 Essential (primary) hypertension: Secondary | ICD-10-CM

## 2021-03-19 MED ORDER — AMLODIPINE BESYLATE 5 MG PO TABS
5 MG | ORAL_TABLET | Freq: Every day | ORAL | 1 refills | Status: DC
Start: 2021-03-19 — End: 2021-05-20

## 2021-03-19 NOTE — Progress Notes (Signed)
Shirley Carlson is a 55 y.o. female evaluated via telephone on 03/19/2021 for Follow-up      History of Present Illness: See A/P for details.    Past Medical History:   Diagnosis Date    Hypertension         Past Surgical History:   Procedure Laterality Date    TUBAL LIGATION         Family History   Problem Relation Age of Onset    Heart Attack Father          Allergies:  No Known Allergies    Health Maintenance   Topic Date Due    COVID-19 Vaccine (1) Never done    Depression Screen  Never done    HIV screen  Never done    Hepatitis C screen  Never done    DTaP/Tdap/Td vaccine (1 - Tdap) Never done    Lipids  Never done    Colorectal Cancer Screen  Never done    Shingles vaccine (1 of 2) Never done    Flu vaccine (1) Never done    Breast cancer screen  10/15/2022    Cervical cancer screen  09/15/2025    Hepatitis A vaccine  Aged Out    Hib vaccine  Aged Out    Meningococcal (ACWY) vaccine  Aged Out    Pneumococcal 0-64 years Vaccine  Aged Out       Physical Exam:  Not completed today as this was audio visit.      ASSESSMENT/PLAN:  1. HTN, goal below 140/90  Overview:  Pt has hx of HTN but has not been on any meds for several years. She has been monitoring BP and says most are above goal. Not able to tell me any #'s.   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 Will discuss at f/u appt.    Return in about 4 weeks (around 04/16/2021).    Total Time: 7 min    Shirley Carlson was evaluated through a synchronous (real-time) audio encounter. Patient identification was verified at the start of the visit. She (or guardian if applicable) is aware that this is a billable service, which includes applicable co-pays. This visit was conducted with the patient's (and/or legal guardian's) verbal consent. She has not had a  related appointment within my department in the past 7 days or scheduled within the next 24 hours.   The patient was located at Home: [address]  [place] [address]  The provider was located at Home (Appt Dept State): [place]    Note: not billable if this call serves to triage the patient into an appointment for the relevant concern    Shirley Mouse, MD

## 2021-04-14 ENCOUNTER — Encounter: Attending: Family Medicine | Primary: Family Medicine

## 2021-05-14 NOTE — Progress Notes (Signed)
Pre Procedure Patient Instructions    Procedure Location hospital:[facility]: [address], [place] - Park in front of the ER and enter through Outpatient Services or enter through main entrance and take stairs or elevator to the 2nd floor waiting room (if locked go to ER entrance to you immediate left) and report to 2nd floor surgical waiting room.   Procedure Date 05/19/21  Arrival Time  1000      Medications:  Medication to be taken the morning of surgery with a few sips of water only: none  Hold or reduce the dosage of the following medication, as discussed:   Do not take diet medications for 4 days prior to your procedure  Stop all supplements, vitamins and herbal remedies one week prior to your procedure, unless your doctor told you to continue taking.  Do not take over the counter pain medications except plain Tylenol or acetaminophen unless your doctor told you to do so.  If you are taking blood thinners, call the doctor performing your procedure and prescribing physician for instructions on when to stop.    Procedure Preparation    Diet Restrictions:No food or drink including gum or mints -after midnight- other than prep.    Skin Preparation:     Using a clean washcloth, wash from neck to toes for 3 minutes.Gently clean the area where your surgery will be done thoroughly  Do not use Hibiclens on or near your face, eyes, ears, or head  Do not put on any deodorants, lotions, powders, or oils afterwards. Be sure to put on clean clothing.    Other Preparation:  Call your surgeon right away if you get any wounds, cuts, scrapes, scabs, rashes, bug bites at or near your operative site.  Bowel prep as instructed by your doctor  Fleets enema as instructed by your doctor  No test required before surgery      Day of Procedure Patient Instruction:  Do not smoke, vape, chew tobacco, drink alcohol or use recreational drugs on the day of your procedure  Remove all jewelry, piercings and metal  accessories  Do not wear contacts, tampons, make-up, lotions, creams, powders, fragrances or deodorant  Do not bring valuables or money  Bring a picture ID and insurance card and any of the following that are applicable to you:    ?? Inhalers    ?? CPAP or BiPAP machine    ?? Remote for spinal cord stimulator or other implanted device    ?? Insulin pump supplies    ?? Walker or other orthopedic device necessary for postop    ?? Storage case for eyeglasses, hearing aids, dentures, etc    ?? A loose button-up shirt if instructed    ?? Copy of your Living Will and/or Medical Durable Power of Attorney    ?? List of current medications including name and dosage    .  If you are going home the same day as your procedure, a support person should accompany you to the facility and must transport you home.  If you plan to take public transportation of any sort, your support person must accompany you home.  You will need someone to stay with you for 24 hours after your procedure with sedation of any kind.       COVID Testing Instructions:  Not required.      Comments:     The information and visitor policy was reviewed with you during your Pre-Admission Testing interview  and you verbalized understanding. If you have any additional questions please contact [phone]    For financial questions regarding your procedure at a [facility] facility, please contact [phone], option 1

## 2021-05-19 ENCOUNTER — Inpatient Hospital Stay: Payer: BLUE CROSS/BLUE SHIELD

## 2021-05-19 MED ORDER — LACTATED RINGERS IV SOLN
INTRAVENOUS | Status: DC | PRN
Start: 2021-05-19 — End: 2021-05-19
  Administered 2021-05-19: 17:00:00 via INTRAVENOUS

## 2021-05-19 MED ORDER — LIDOCAINE HCL 1 % IJ SOLN
1 % | INTRAMUSCULAR | Status: AC
Start: 2021-05-19 — End: ?

## 2021-05-19 MED ORDER — PROPOFOL 200 MG/20ML IV EMUL
200 MG/20ML | INTRAVENOUS | Status: AC
Start: 2021-05-19 — End: ?

## 2021-05-19 MED ORDER — PROPOFOL 200 MG/20ML IV EMUL
200 MG/20ML | INTRAVENOUS | Status: DC | PRN
Start: 2021-05-19 — End: 2021-05-19
  Administered 2021-05-19 (×3): 100 via INTRAVENOUS

## 2021-05-19 MED ORDER — LIDOCAINE HCL 1 % IJ SOLN
1 % | INTRAMUSCULAR | Status: DC | PRN
Start: 2021-05-19 — End: 2021-05-19
  Administered 2021-05-19: 17:00:00 50 via INTRAVENOUS

## 2021-05-19 NOTE — Anesthesia Procedure Notes (Signed)
Airway  Date/Time: 05/19/2021 11:35 AM  Urgency: elective    Airway not difficult    General Information and Staff    Patient location during procedure: OR  Anesthesiologist: Jennette Kettle, MD PhD  Resident/CRNA: Lezlie Lye, APRN - CRNA  Performed: resident/CRNA     Indications and Patient Condition  Indications for airway management: anesthesia  Sedation level: deep  Preoxygenated: no  Mask difficulty assessment: not attempted    Final Airway Details  Final airway type: mask (3L NC)         Number of attempts at approach: 1  Ventilation between attempts: spontaneous    Additional Comments  Supplemental oxygen for MAC. Spontaneous ventilation via natural airway.  no

## 2021-05-19 NOTE — Other (Signed)
Patient requests discharge information not be given to driver. Discharge instructions reviewed with patient herself, and a printed copy sent home with patient at discharge.

## 2021-05-19 NOTE — Op Note (Signed)
Procedure: Colonoscopy    Indication: Colon cancer screening; Large colon polyp    Medications: IV Propofol per anesthesia.    Procedure:  The patient presents for a diagnostic colonoscopy. Prior to the procedure, a History and Physical was performed, and patient medications and allergies were reviewed. The patients tolerance of previous anesthesia was also reviewed. The procedure, indications, preparation and potential complications were explained to the patient, who indicated understanding and signed the corresponding consent forms. After reviewing the risks and benefits, the patient was deemed in satisfactory condition to undergo the procedure. Appropriate time-out protocol was followed: the correct patient, the correct procedure, and the correct equipment in the room were confirmed. Patient was placed in the left lateral decubitus position. Continuous pulse oximetry and blood pressure monitoring were used throughout the procedure. Supplemental oxygen was administered. IV general anesthesia was provided by an anesthesiologist.  The colonoscope was then introduced through the rectum and advanced under direct visualization until the cecum and terminal ileum was reached. The appendiceal orifice and the ileo-cecal valve were identified. Careful visualization was performed as the instrument was withdrawn.The colonoscope was retroflexed within the rectum. Quality of prep was excellent. Patient tolerance to procedure was excellent. Estimated blood loss: none    Limitations/Complications: There were no apparent limitations or complications.    Findings:  A single pedunculated descending colon polyp measuring 2 cm in size. This was removed using a hot snare. A single hemo clip was placed.     Impression:  Colon polyp. Polypectomy performed. Hemo clip placed.     Plan:  -Repeat colonoscopy in 3 years    Pathology:  To be reviewed once available.

## 2021-05-19 NOTE — Anesthesia Pre-Procedure Evaluation (Signed)
Department of Anesthesiology  Preprocedure Note       Name:  Shirley Carlson   Age:  56 y.o.  DOB:  Mar 27, 1966                                          MRN:  [id number]         Date:  05/19/2021      Surgeon: Juliann Mule):  Fonnie Jarvis, MD    Procedure: Procedure(s):  EMR    Medications prior to admission:   Prior to Admission medications    Medication Sig Start Date End Date Taking? Authorizing Provider   amLODIPine (NORVASC) 5 MG tablet Take 1 tablet by mouth daily 03/19/21   Jonathon Resides, MD       Current medications:    No current facility-administered medications for this encounter.       Allergies:    Allergies   Allergen Reactions   ??? Sulfa Antibiotics Other (See Comments)     Caused swelling in lymph nodes.   ??? Penicillins Rash       Problem List:    Patient Active Problem List   Diagnosis Code   ??? HTN, goal below 140/90 I10       Past Medical History:        Diagnosis Date   ??? Hypertension    ??? Positive colorectal cancer screening using Cologuard test        Past Surgical History:  History reviewed. No pertinent surgical history.    Social History:    Social History     Tobacco Use   ??? Smoking status: Every Day     Packs/day: 1.00     Years: 10.00     Pack years: 10.00     Types: Cigarettes   ??? Smokeless tobacco: Never   Substance Use Topics   ??? Alcohol use: Never                                Ready to quit: Not Answered  Counseling given: Not Answered      Vital Signs (Current):   Vitals:    05/14/21 1134   BP: (!) 142/94   Pulse: 64   Resp: 16   Temp: 97.7 ??F (36.5 ??C)   SpO2: 100%   Weight: 197 lb (89.4 kg)   Height: _0  (1.651 m)                                              BP Readings from Last 3 Encounters:   05/14/21 (!) 142/94       NPO Status: Time of last liquid consumption: 2300                        Time of last solid consumption: 2300                        Date of last liquid consumption: 05/18/21                        Date of last solid food consumption: 05/18/21    BMI:  Wt  Readings from Last 3 Encounters:   05/14/21 197 lb (89.4 kg)     Body mass index is 32.78 kg/m??.    CBC: No results found for: WBC, RBC, HGB, HCT, MCV, RDW, PLT    CMP: No results found for: NA, K, CL, CO2, BUN, CREATININE, GFRAA, AGRATIO, LABGLOM, GLUCOSE, GLU, PROT, CALCIUM, BILITOT, ALKPHOS, AST, ALT    POC Tests: No results for input(s): POCGLU, POCNA, POCK, POCCL, POCBUN, POCHEMO, POCHCT in the last 72 hours.    Coags: No results found for: PROTIME, INR, APTT    HCG (If Applicable): No results found for: PREGTESTUR, PREGSERUM, HCG, HCGQUANT     ABGs: No results found for: PHART, PO2ART, PCO2ART, HCO3ART, BEART, O2SATART     Type & Screen (If Applicable):  No results found for: LABABO, LABRH    Drug/Infectious Status (If Applicable):  No results found for: HIV, HEPCAB    COVID-19 Screening (If Applicable): No results found for: COVID19        Anesthesia Evaluation    Airway: Mallampati: II  TM distance: >3 FB   Neck ROM: full  Mouth opening: > = 3 FB   Dental:          Pulmonary:normal exam                               Cardiovascular:    (+) hypertension:,         Rhythm: regular  Rate: normal                    Neuro/Psych:               GI/Hepatic/Renal:             Endo/Other:                     Abdominal:             Vascular:          Other Findings: Patient awake, alert, and oriented. No gross neurological deficits appreciated. Focused physical exam as above.           Anesthesia Plan      MAC     ASA 2       Induction: intravenous.      Anesthetic plan and risks discussed with patient.        Attending anesthesiologist reviewed and agrees with Preprocedure content                Jennette Kettle, MD PhD   05/19/2021

## 2021-05-19 NOTE — Anesthesia Post-Procedure Evaluation (Signed)
Department of Anesthesiology  Postprocedure Note    Patient: Shirley Carlson  MRN: [id number]  Birthdate: 05/01/1966  Date of evaluation: 05/19/2021      Procedure Summary     Date: 05/19/21 Room / Location: [facility] / [facility]    Anesthesia Start: 1130 Anesthesia Stop: 9485    Procedure: EMR Diagnosis:       Special screening for malignant neoplasms, colon      Abnormal feces      Hyperplastic polyp of intestine      (Special screening for malignant neoplasms, colon [Z12.11])    Surgeons: Fonnie Jarvis, MD Responsible Provider: Jennette Kettle, MD PhD    Anesthesia Type: MAC ASA Status: 2          Anesthesia Type: MAC    Aldrete Phase I:      Aldrete Phase II: Aldrete Score: 8      Anesthesia Post Evaluation    Patient location during evaluation: PACU  Level of consciousness: sleepy but conscious  Airway patency: patent  Nausea & Vomiting: no nausea and no vomiting  Complications: no  Cardiovascular status: hemodynamically stable  Respiratory status: acceptable, nonlabored ventilation and spontaneous ventilation  Hydration status: euvolemic  Comments: Vital signs in PACU reviewed by anesthesia provider and documented in Handoff.  Multimodal analgesia pain management approach

## 2021-05-19 NOTE — H&P (Signed)
Gastroenterology Preoperative H&P   Admit Date: 05/19/2021; ;  LOS: 0 days      Date of Service: 05/19/2021    Procedure Indication:   Colon polyp     REVIEW OF SYSTEMS:   Pertinent positives are mentioned in the HPI.   As above and:  CONSTITUTIONAL:  negative  EYES:  negative   HEENT:  negative   RESPIRATORY:  negative   CARDIOVASCULAR:  negative    GASTROINTESTINAL:  negative   GENITOURINARY:  negative   INTEGUMENT/BREAST:  negative   HEMATOLOGIC/LYMPHATIC:  negative   ALLERGIC/IMMUNOLOGIC:  negative   ENDOCRINE:  negative   MUSCULOSKELETAL:  negative   NEUROLOGICAL:  negative   BEHAVIOR/PSYCH:  negative    PAST MEDICAL HISTORY:     Past Medical History:   Diagnosis Date    Hypertension     Positive colorectal cancer screening using Cologuard test        PAST SURGICAL HISTORY:   History reviewed. No pertinent surgical history.    FAMILY HISTORY:   No family history of GI malignancy    SOCIAL HISTORY:     Social History     Tobacco Use    Smoking status: Every Day     Packs/day: 1.00     Years: 10.00     Pack years: 10.00     Types: Cigarettes    Smokeless tobacco: Never   Vaping Use    Vaping Use: Never used   Substance Use Topics    Alcohol use: Never    Drug use: Yes     Types: Marijuana (Weed)       PHYSICAL EXAM:   Vitals (last):  BP (!) 142/94    Pulse 64    Temp 97.7 ??F (36.5 ??C)    Resp 16    Ht 5\' 5"  (1.651 m)    Wt 197 lb (89.4 kg)    SpO2 100%    BMI 32.78 kg/m??     Vitals (min/max last 24 hrs):  No data recorded  No data recorded  No data recorded  No data recorded  No data recorded     Physical Exam:  CONSTITUTIONAL:  awake, alert, cooperative, no apparent distress  EYES:  Lids and lashes normal, pupils equal & round, extra ocular muscles intact, sclera clear, conjunctivae normal  ENT:  Normocephalic, without obvious abnormality, atraumatic, external ears without lesions, oropharynx with moist mucus membranes  NECK:  Supple, symmetrical, trachea midline, no adenopathy  CHEST: No increased work of  breathing. Chest symmetrical.  ABDOMEN: Soft, nontender, nondistended  MUSCULOSKELETAL:  There is no redness, warmth, or swelling of the joints. No lower extremity swelling  NEUROLOGIC:  Awake, alert, oriented to name, place and time.  No asterixis    HOME MEDICATIONS:   Medications Prior to Admission: amLODIPine (NORVASC) 5 MG tablet, Take 1 tablet by mouth daily    INPATIENT MEDICATIONS:   Scheduled Medications:     Continuous Medications:     PRN Medications;      LABS AND STUDIES (reviewed):   Labs (reviewed):  CBC  No results for input(s): WBC, HGB, HCT, PLT in the last 72 hours.   BMP  No results for input(s): NA, K, CL, CO2, BUN, CA, MG, PHOS in the last 72 hours.    Invalid input(s): CREAT, GLUC  LFT  No results for input(s): PROT, ALB, BILITOT, BILIDIR, ALT, AST, ALKPHOS in the last 72 hours.  Coags  No results for input(s): PROTIME, INR, PTT in  the last 72 hours.    Imaging/Studies/ECGs (reviewed):    ASSESSMENT/PLAN:   Colonoscopy with EMR      Signed:   Otis Peak, MD, MD  05/19/2021 11:23 AM

## 2021-05-20 ENCOUNTER — Ambulatory Visit
Admit: 2021-05-20 | Discharge: 2021-05-20 | Payer: BLUE CROSS/BLUE SHIELD | Attending: Family Medicine | Primary: Family Medicine

## 2021-05-20 MED ORDER — AMLODIPINE BESYLATE 10 MG PO TABS
10 MG | ORAL_TABLET | Freq: Every day | ORAL | 3 refills | Status: AC
Start: 2021-05-20 — End: ?

## 2021-05-20 NOTE — Progress Notes (Signed)
Shirley Carlson (DOB:  15-Apr-1966) is a 56 y.o. female, here for evaluation of the following chief complaint(s):  Follow-up (Blood pressure follow up)        ASSESSMENT/PLAN:  1. HTN, goal below 140/90  Overview:  - Taking amlodipine 5mg . She has been taking her BP manually on herself. She thinks her BPs were 180s/90s at home but her stethoscope broke. 130/90 today. She takes it after drinking a cup of coffee and smoking a cigarette. Discussed how this can elevate BP.  - Will increase amlodipine to 10mg . Instructed her to get an automatic cuff and take it as soon as she wakes up before any stimulants.   - Pt will cont to monitor at home. Reviewed goal BP. Call w any issues.  Orders:  -     amLODIPine (NORVASC) 10 MG tablet; Take 1 tablet by mouth daily, Disp-90 tablet, R-3Normal  2. Screening for hyperlipidemia  -     Comprehensive Metabolic Panel  -     Lipid Panel      No results found for any visits on 05/20/21.    Return in about 6 months (around 11/17/2021).    Vitals:    05/20/21 1358   BP: (!) 130/90   Site: Left Upper Arm   Position: Sitting   Cuff Size: Large Adult   Pulse: (!) 103   Resp: 18   SpO2: 96%   Weight: 181 lb 8 oz (82.3 kg)   Height: 5\' 5"  (1.651 m)       Physical Exam:  Physical Exam  Constitutional:       General: She is not in acute distress.     Appearance: Normal appearance.   HENT:      Head: Normocephalic and atraumatic.   Eyes:      Extraocular Movements: Extraocular movements intact.      Pupils: Pupils are equal, round, and reactive to light.   Cardiovascular:      Rate and Rhythm: Normal rate and regular rhythm.      Heart sounds: No murmur heard.    No friction rub. No gallop.   Pulmonary:      Effort: Pulmonary effort is normal.      Breath sounds: Normal breath sounds. No wheezing, rhonchi or rales.   Musculoskeletal:      Right lower leg: No edema.      Left lower leg: No edema.   Skin:     General: Skin is warm and dry.   Neurological:      General: No focal deficit present.       Mental Status: She is alert and oriented to person, place, and time.   Psychiatric:         Mood and Affect: Mood normal.         Behavior: Behavior normal.         Current Outpatient Medications   Medication Sig Dispense Refill    amLODIPine (NORVASC) 10 MG tablet Take 1 tablet by mouth daily 90 tablet 3     No current facility-administered medications for this visit.        No data recorded     Past Medical History:   Diagnosis Date    Hypertension     Positive colorectal cancer screening using Cologuard test         Past Surgical History:   Procedure Laterality Date    COLONOSCOPY N/A 05/19/2021    EMR performed by 07/18/21, MD at [facility] ENDOSCOPY  Family History   Problem Relation Age of Onset    Heart Attack Father        History of Present Illness: See A/P for details.    Allergies:  Allergies   Allergen Reactions    Sulfa Antibiotics Other (See Comments)     Caused swelling in lymph nodes.    Penicillins Rash     Health Maintenance   Topic Date Due    COVID-19 Vaccine (1) Never done    Pneumococcal 0-64 years Vaccine (1 - PCV) Never done    Depression Screen  Never done    HIV screen  Never done    Hepatitis C screen  Never done    DTaP/Tdap/Td vaccine (1 - Tdap) Never done    Diabetes screen  Never done    Lipids  Never done    Shingles vaccine (1 of 2) Never done    Flu vaccine (1) Never done    Breast cancer screen  10/15/2022    Cervical cancer screen  09/15/2025    Colorectal Cancer Screen  05/20/2031    Hepatitis A vaccine  Aged Out    Hib vaccine  Aged Out    Meningococcal (ACWY) vaccine  Aged Editor, commissioning signed by:  --Georges Mouse, MD

## 2021-05-21 LAB — LIPID PANEL
Chol/HDL Ratio: 3.2 (ref 0.0–4.4)
Cholesterol: 240 mg/dL — ABNORMAL HIGH (ref 100–200)
HDL: 75 mg/dL (ref >=50)
LDL Cholesterol: 141.2 mg/dL — ABNORMAL HIGH (ref 0.0–100.0)
LDL/HDL Ratio: 1.9
Triglycerides: 119 mg/dL (ref 0–149)
VLDL: 23.8 mg/dL (ref 5.0–40.0)

## 2021-05-21 LAB — COMPREHENSIVE METABOLIC PANEL
ALT: 24 U/L (ref 0–35)
AST: 25 U/L (ref 0–35)
Albumin/Globulin Ratio: 2.2 (ref 1.00–2.70)
Albumin: 4.8 g/dL (ref 3.5–5.2)
Alk Phosphatase: 103 U/L (ref 35–117)
Anion Gap: 12 mmol/L (ref 2–17)
BUN: 10 mg/dL (ref 6–20)
CO2: 25 mmol/L (ref 22–29)
Calcium: 9.3 mg/dL (ref 8.6–10.0)
Chloride: 106 mmol/L (ref 98–107)
Creatinine: 1 mg/dL (ref 0.5–1.0)
Est, Glom Filt Rate: 67 mL/min/1.73m?? — ABNORMAL LOW (ref >=90)
Globulin: 2.2 g/dL (ref 1.9–4.4)
Glucose: 87 mg/dL (ref 70–99)
OSMOLALITY CALCULATED: 283 mOsm/kg (ref 270–287)
Potassium: 4.1 mmol/L (ref 3.5–5.3)
Sodium: 143 mmol/L (ref 135–145)
Total Bilirubin: 0.36 mg/dL (ref 0.00–1.20)
Total Protein: 7 g/dL (ref 6.4–8.3)

## 2021-11-17 ENCOUNTER — Encounter

## 2021-11-17 ENCOUNTER — Ambulatory Visit
Admit: 2021-11-17 | Discharge: 2021-11-17 | Payer: BLUE CROSS/BLUE SHIELD | Attending: Family Medicine | Primary: Family Medicine

## 2021-11-17 DIAGNOSIS — I10 Essential (primary) hypertension: Secondary | ICD-10-CM

## 2021-11-17 LAB — CBC
Hematocrit: 41.4 % (ref 34.0–47.0)
Hemoglobin: 13.3 g/dL (ref 11.5–15.7)
MCH: 28.2 pg (ref 27.0–34.5)
MCHC: 32.1 g/dL (ref 32.0–36.0)
MCV: 87.9 fL (ref 81.0–99.0)
MPV: 12.5 fL (ref 7.2–13.2)
NRBC Absolute: 0 10*3/uL (ref 0.000–0.012)
NRBC Automated: 0 % (ref 0.0–0.2)
Platelets: 275 10*3/uL (ref 140–440)
RBC: 4.71 x10e6/mcL (ref 3.60–5.20)
RDW: 14.6 % (ref 11.0–16.0)
WBC: 7.6 10*3/uL (ref 3.8–10.6)

## 2021-11-17 LAB — FERRITIN: Ferritin: 74.4 ng/mL (ref 13.0–150.0)

## 2021-11-17 LAB — TSH WITH REFLEX: TSH: 5.32 mcIU/mL — ABNORMAL HIGH (ref 0.358–3.740)

## 2021-11-17 LAB — T4, FREE: T4 Free: 1.21 ng/dL (ref 0.82–1.70)

## 2021-11-17 MED ORDER — HYDROCHLOROTHIAZIDE 12.5 MG PO TABS
12.5 MG | ORAL_TABLET | Freq: Every morning | ORAL | 3 refills | Status: AC
Start: 2021-11-17 — End: ?

## 2021-11-17 NOTE — Progress Notes (Signed)
Patient Consent?AYES     Site?RIGHT AC     Site Prepped and Cleaned?YES    Needle Gauge Type? STRAIGHT    Needle Gauge Size?21    Pressure Applied to Site?YES    Number of Tubes Collected?2    Number of Attempts?1    Receiving Lab Location?[facility]    Patient Tolerated?YES      Procedure Code 25638

## 2021-11-17 NOTE — Progress Notes (Signed)
Shirley Carlson (DOB:  08-19-1965) is a 56 y.o. female, here for evaluation of the following chief complaint(s):  Follow-up        ASSESSMENT/PLAN:  1. HTN, goal below 140/90  Overview:  Taking amlodipine 10 mg. Tolerating well. BP above goal today. She says she is monitoring at home and most are borderline.   -Will add on HCTZ 12.5mg .   Orders:  -     hydroCHLOROthiazide (HYDRODIURIL) 12.5 MG tablet; Take 1 tablet by mouth every morning, Disp-90 tablet, R-3Normal  2. Cold intolerance  Overview:  Pt says when she works in [employer] she goes in and out of the freezer. She says the cold temperatures are very uncomfortable for her. She believes she is anemic. She also believes this is causing her BP to rise. No longer having menstrual cycle.   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    Return in about 4 weeks (around 12/15/2021).    Vitals:    11/17/21 0957   BP: (!) 142/102   Site: Right Upper Arm   Position: Sitting   Cuff Size: Large Adult   Pulse: 80   SpO2: 98%   Weight: 190 lb 6.4 oz (86.4 kg)   Height: 5\' 4"  (1.626 m)       Physical Exam:  Physical Exam  Constitutional:       General: She is not in acute distress.     Appearance: Normal appearance.   HENT:      Head: Normocephalic and atraumatic.   Eyes:      Extraocular Movements: Extraocular movements intact.      Pupils: Pupils are equal, round, and reactive to light.   Cardiovascular:      Rate and Rhythm: Normal rate and regular rhythm.      Heart sounds: No murmur heard.    No friction rub. No gallop.   Pulmonary:      Effort: Pulmonary effort is normal.      Breath sounds: Normal breath sounds. No wheezing, rhonchi or rales.   Musculoskeletal:      Right lower leg: No edema.      Left lower leg: No edema.   Skin:     General: Skin is warm and dry.   Neurological:      General: No focal deficit present.       Mental Status: She is alert and oriented to person, place, and time.   Psychiatric:         Mood and Affect: Mood normal.         Behavior: Behavior normal.         Current Outpatient Medications   Medication Sig Dispense Refill    hydroCHLOROthiazide (HYDRODIURIL) 12.5 MG tablet Take 1 tablet by mouth every morning 90 tablet 3    amLODIPine (NORVASC) 10 MG tablet Take 1 tablet by mouth daily 90 tablet 3     No current facility-administered medications for this visit.        PHQ-9 Total Score: 0 (11/17/2021 10:01 AM)       Past Medical History:   Diagnosis Date    Hypertension     Osteoarthritis 2001    Positive colorectal cancer screening using Cologuard test  Past Surgical History:   Procedure Laterality Date    COLONOSCOPY N/A 05/19/2021    EMR performed by Otis Peak, MD at [facility] ENDOSCOPY       Family History   Problem Relation Age of Onset    Heart Attack Father     Arthritis Father         Deceased    High Blood Pressure Father        History of Present Illness: See A/P for details.    Allergies:  Allergies   Allergen Reactions    Sulfa Antibiotics Other (See Comments)     Caused swelling in lymph nodes.    Penicillins Rash     Health Maintenance   Topic Date Due    COVID-19 Vaccine (1) Never done    Pneumococcal 0-64 years Vaccine (1 - PCV) Never done    HIV screen  Never done    Hepatitis C screen  Never done    DTaP/Tdap/Td vaccine (1 - Tdap) Never done    Shingles vaccine (1 of 2) Never done    Flu vaccine (1) 12/08/2021    Breast cancer screen  10/15/2022    Depression Screen  11/18/2022    Cervical cancer screen  09/15/2025    Lipids  05/20/2026    Colorectal Cancer Screen  05/20/2031    Hepatitis A vaccine  Aged Out    Hib vaccine  Aged Out    Meningococcal (ACWY) vaccine  Aged Editor, commissioning signed by:  --Georges Mouse, MD

## 2021-11-19 NOTE — Other (Signed)
Attempted to call LVM

## 2021-11-20 NOTE — Other (Signed)
Attempted to contact LVM

## 2021-11-23 NOTE — Other (Signed)
Attempted to contact

## 2021-11-23 NOTE — Other (Signed)
Letter sent on Mychart
